# Patient Record
Sex: Male | Born: 1995 | Race: Black or African American | Hispanic: No | Marital: Single | Smoking: Never smoker
Health system: Southern US, Community
[De-identification: ages and names within clinical notes are randomized; demographics above are authoritative.]

---
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
## 2016-05-05 IMAGING — CR DG WRIST COMPLETE 3+V*L*
4 series · 4 of 4 positions shown · non-contrast
Comparison: None.

CLINICAL DATA: Patient fell, hyper flexing the wrist while playing
football today. Pain and swelling over the scaphoid area.

EXAM:
LEFT WRIST - COMPLETE 3+ VIEW

[x wrist pa left]
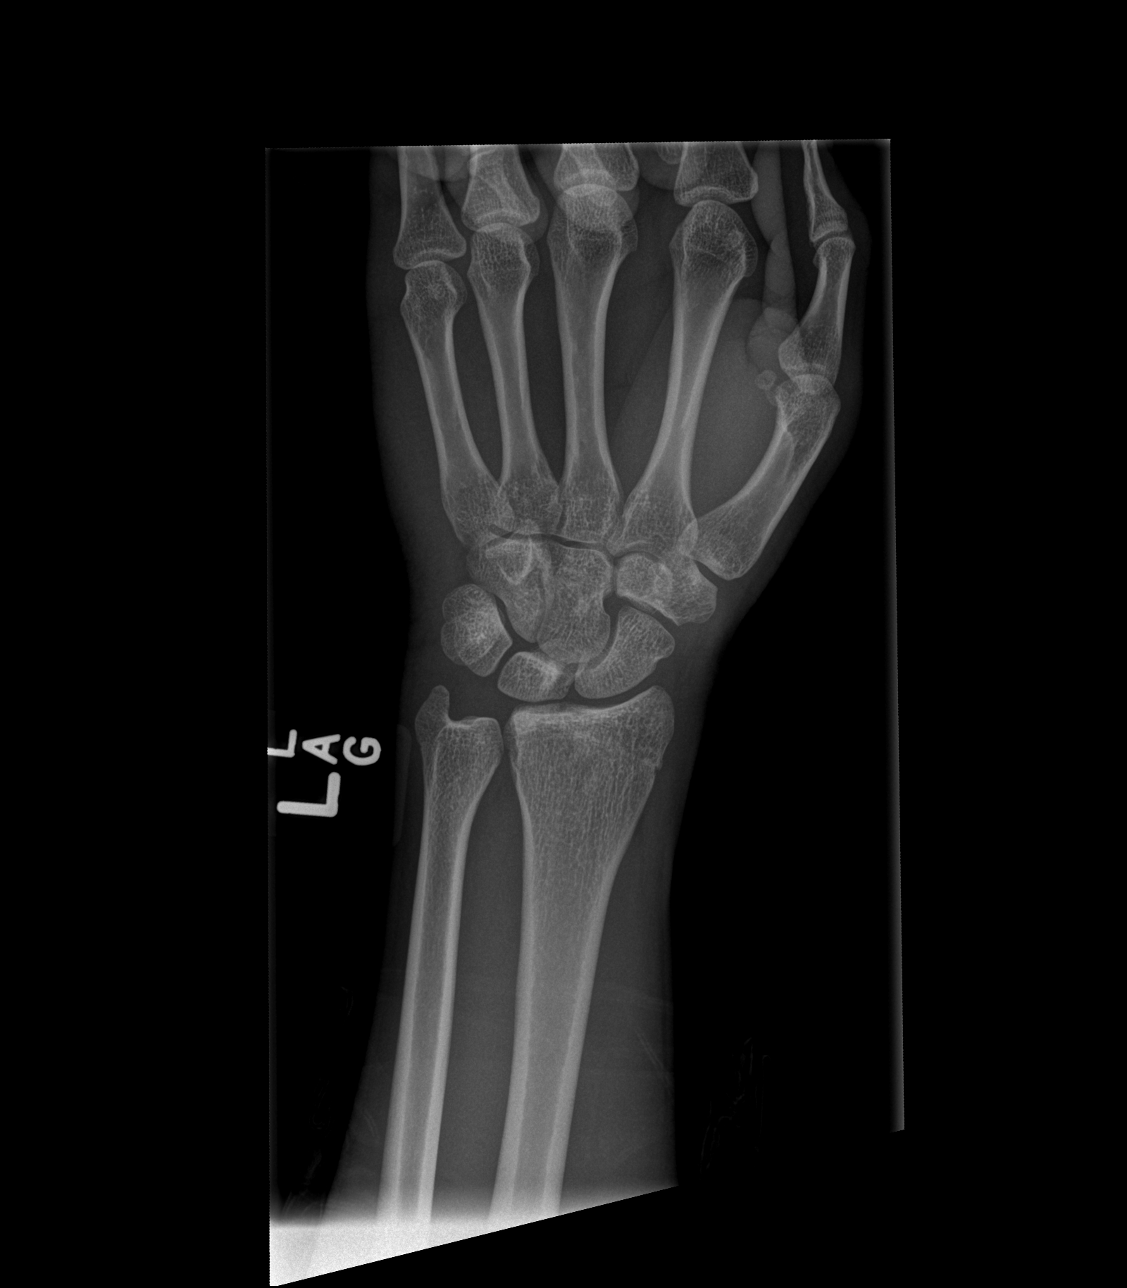

[x wrist obl left]
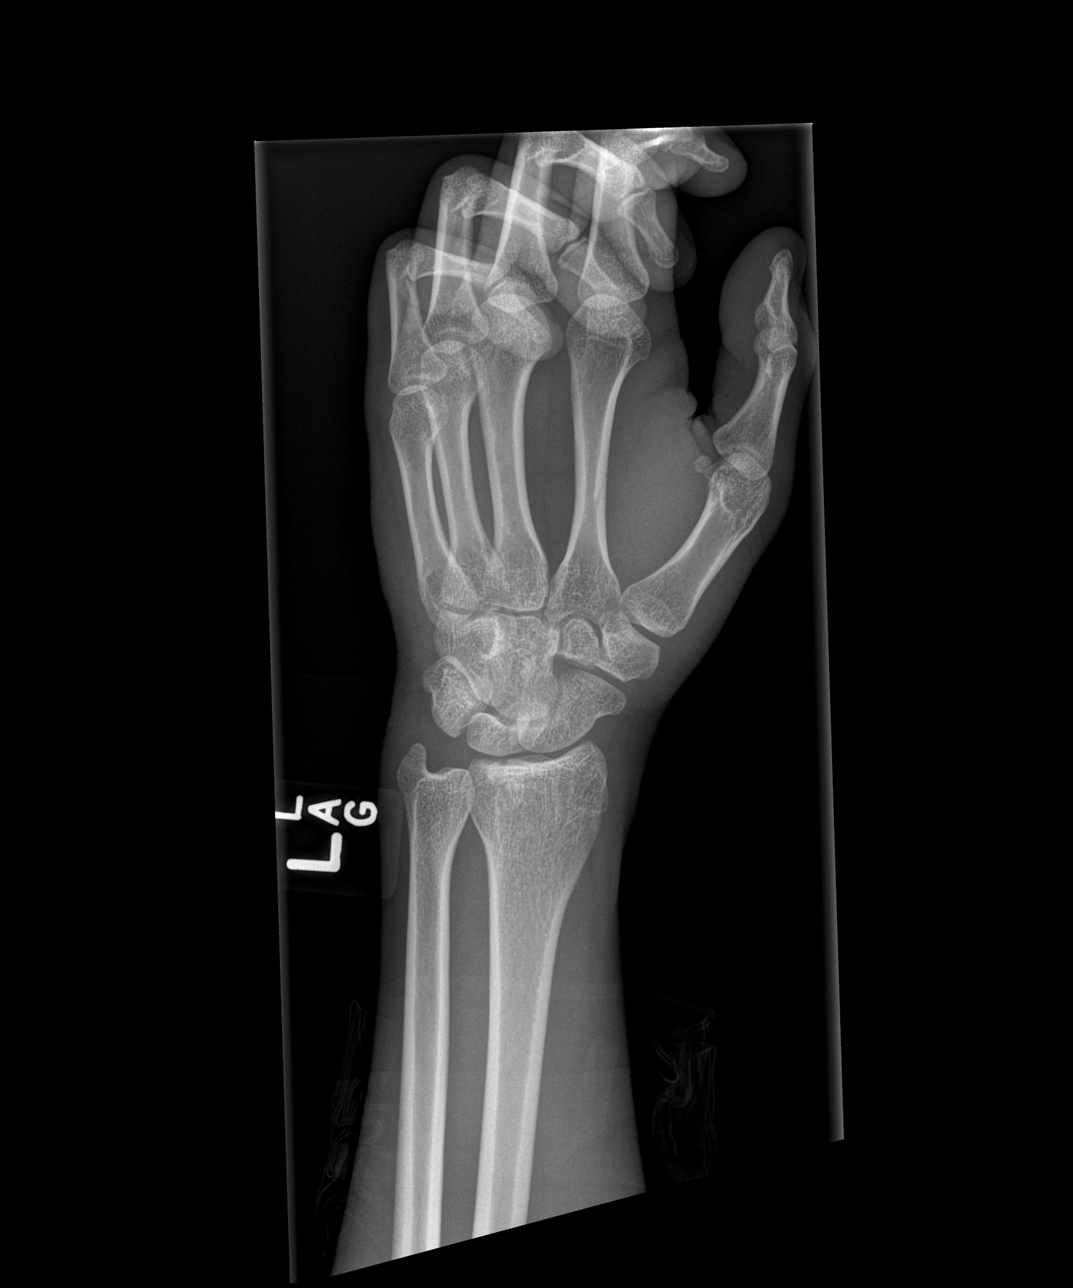

[x wrist lat left]
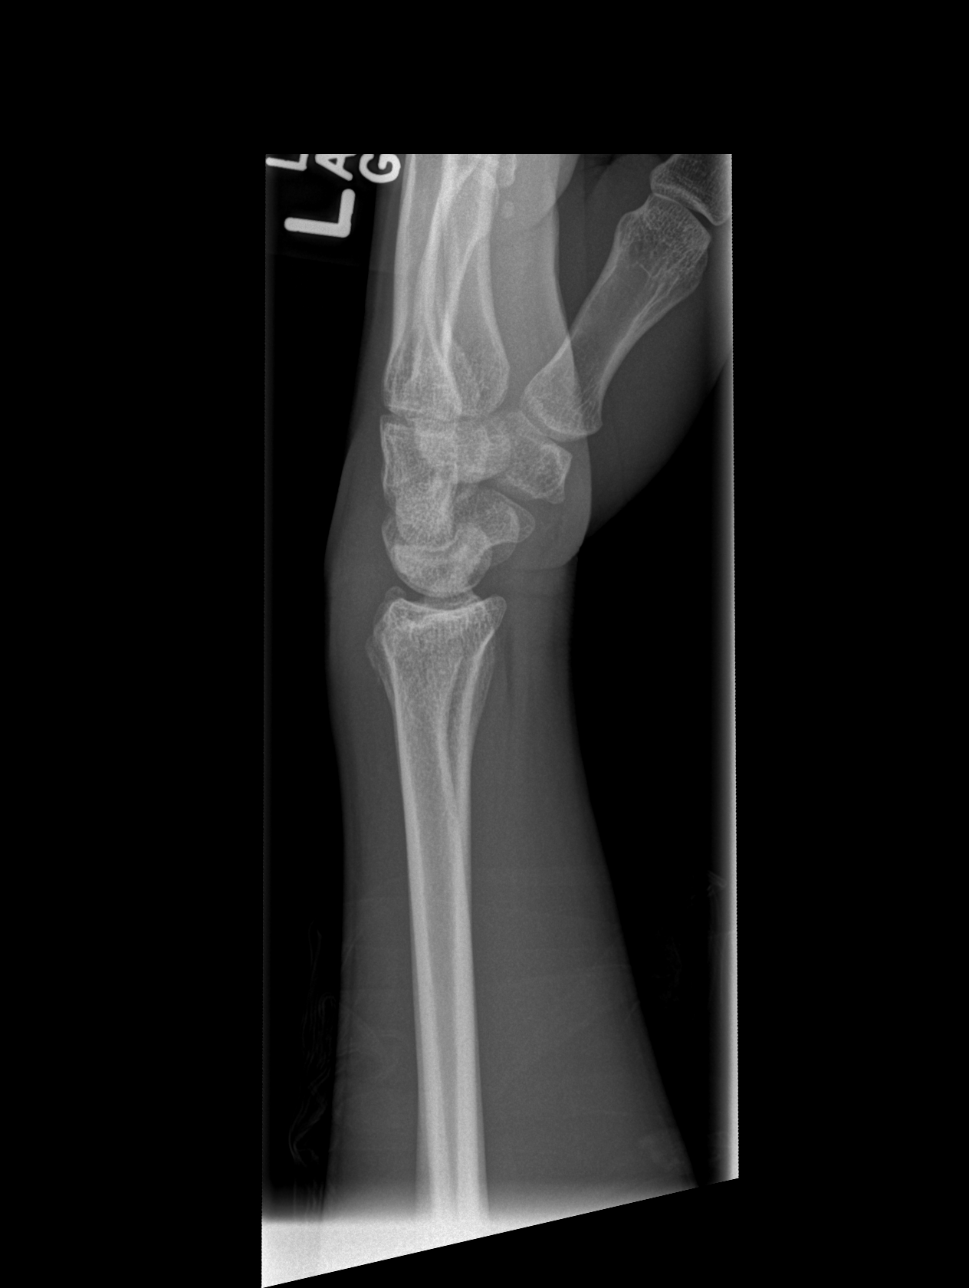

[x wrist navicular view left]
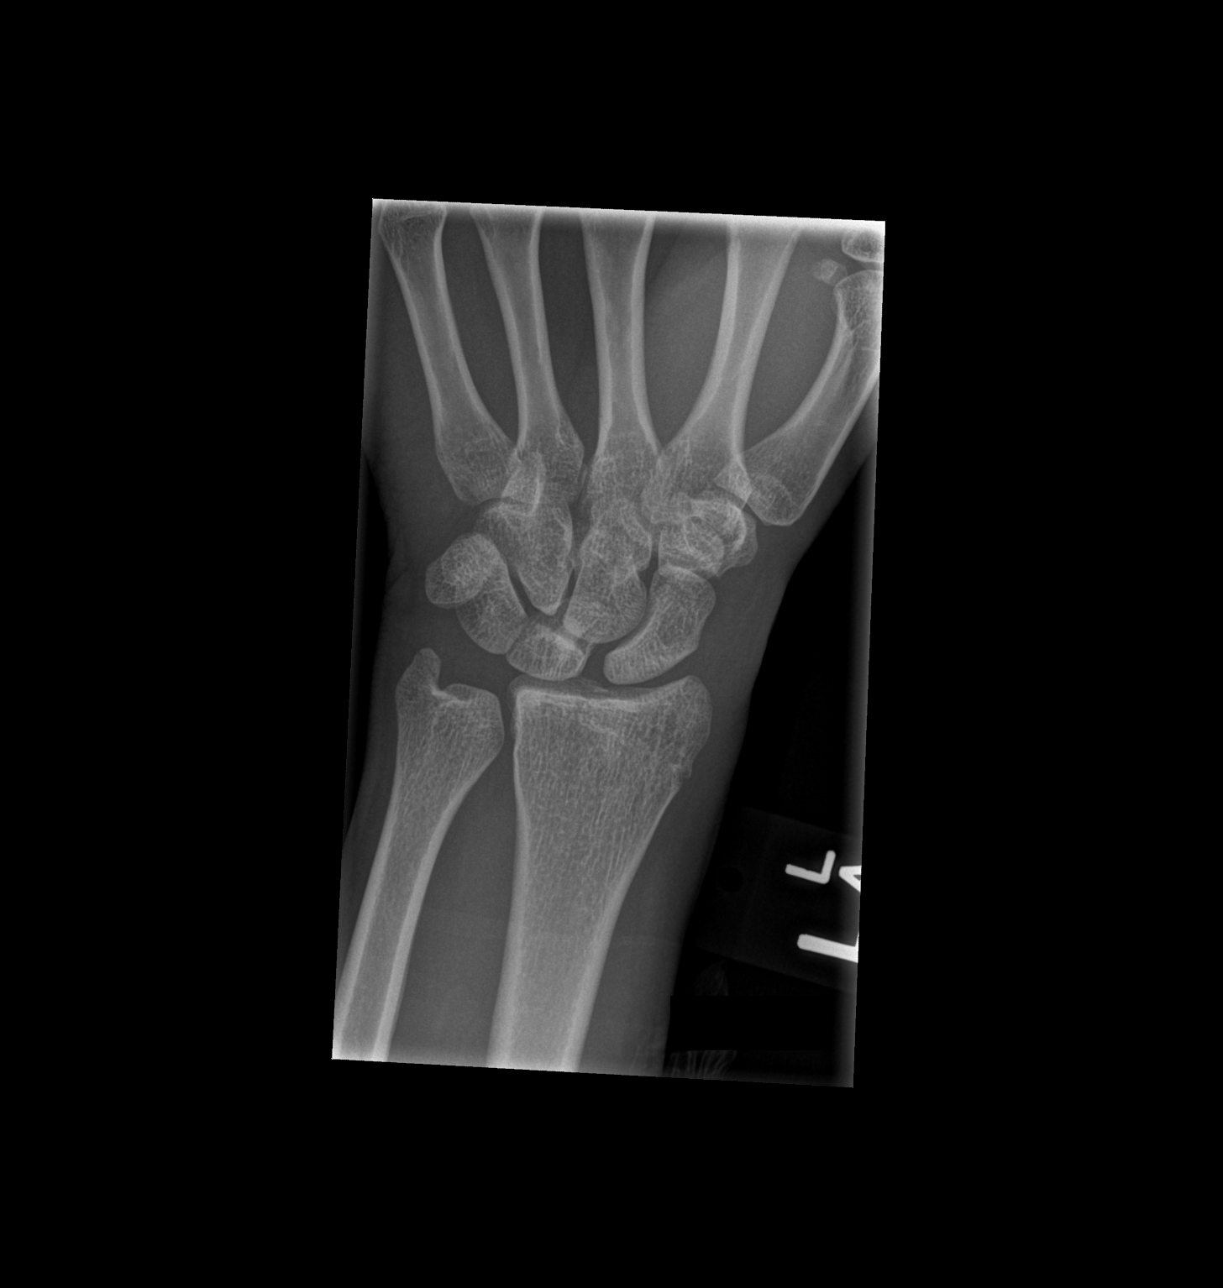

[4 of 4 positions shown; findings below may reference images not displayed]

FINDINGS: Dorsal soft tissue swelling over the left wrist. Subtle cortical
irregularity along the dorsal aspect of the distal radial metaphysis
probably represents a nondisplaced fracture. No dislocation. Carpal
bones appear intact.
IMPRESSION: Dorsal soft tissue swelling. Subtle cortical irregularity along the
dorsal aspect of the distal radial metaphysis probably representing
nondisplaced fracture.
